# Patient Record
Sex: Female | Born: 2000 | Race: White | Hispanic: No | Marital: Single | Smoking: Never smoker
Health system: Southern US, Community
[De-identification: ages and names within clinical notes are randomized; demographics above are authoritative.]

## PROBLEM LIST (undated history)

## (undated) HISTORY — PX: WISDOM TOOTH EXTRACTION: SHX21

---
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
## 2020-06-21 ENCOUNTER — Other Ambulatory Visit: Payer: Self-pay | Admitting: Pediatrics

## 2020-06-21 ENCOUNTER — Ambulatory Visit
Admission: RE | Admit: 2020-06-21 | Discharge: 2020-06-21 | Disposition: A | Discharge status: Home / Self Care | Payer: 59 | Source: Ambulatory Visit | Attending: Pediatrics | Admitting: Pediatrics

## 2020-06-21 DIAGNOSIS — R591 Generalized enlarged lymph nodes: Secondary | ICD-10-CM

## 2021-06-17 ENCOUNTER — Encounter: Payer: Self-pay | Admitting: Family

## 2021-06-17 ENCOUNTER — Other Ambulatory Visit: Payer: Self-pay

## 2021-06-17 ENCOUNTER — Ambulatory Visit (INDEPENDENT_AMBULATORY_CARE_PROVIDER_SITE_OTHER): Payer: 59 | Admitting: Family

## 2021-06-17 VITALS — BP 110/60 | HR 90 | Temp 97.3°F | Ht 62.4 in | Wt 156.4 lb

## 2021-06-17 DIAGNOSIS — F419 Anxiety disorder, unspecified: Secondary | ICD-10-CM | POA: Diagnosis not present

## 2021-06-17 DIAGNOSIS — R5383 Other fatigue: Secondary | ICD-10-CM | POA: Diagnosis not present

## 2021-06-17 DIAGNOSIS — F32A Depression, unspecified: Secondary | ICD-10-CM

## 2021-06-17 MED ORDER — SERTRALINE HCL 25 MG PO TABS: 25.0000 mg | ORAL_TABLET | Freq: Every day | ORAL | 0 refills | Status: DC

## 2021-06-17 NOTE — Assessment & Plan Note (Addendum)
Pt present with her mother. Sees therapist locally and at college in [place]. Would like to start a medication. Discussed different options, pt would like to try zoloft. Advised mother & pt. on use and side effects, including black warning label, will be living with mother over holiday break, f/u in 2 weeks.

## 2021-06-17 NOTE — Patient Instructions (Addendum)
Welcome to [facility]! It was a pleasure meeting you today.  As discussed, I have sent your prescription to your pharmacy. Please schedule a 2 week follow up visit today. I have attached some information that may also be helpful to you.  Have a wonderful holiday!  PLEASE NOTE:  If you had any LAB tests please let us know if you have not heard back within a few days. You may see your results on MyChart before we have a chance to review them but we will give you a call once they are reviewed by [place]. If we ordered any REFERRALS today, please let us know if you have not heard from their office within the next week.  Let us know through MyChart if you are needing REFILLS, or have your pharmacy send [place] the request. You can also use MyChart to communicate with me or any office staff.  Please try these tips to maintain a healthy lifestyle:  Eat most of your calories during the day when you are active. Eliminate processed foods including packaged sweets (pies, cakes, cookies), reduce intake of potatoes, white bread, white pasta, and white rice. Look for whole grain options, oat flour or almond flour.  Each meal should contain half fruits/vegetables, one quarter protein, and one quarter carbs (no bigger than a computer mouse).  Cut down on sweet beverages. This includes juice, soda, and sweet tea. Also watch fruit intake, though this is a healthier sweet option, it still contains natural sugar! Limit to 3 servings daily.  Drink at least 1 glass of water with each meal and aim for at least 8 glasses per day  Exercise at least 150 minutes every week.

## 2021-06-17 NOTE — Assessment & Plan Note (Signed)
HX of iron def anemia, pt couldn't tolerate iron supplements. Advised on ER and lower dose formulations as well as eating an iron rich diet. Pt refusing labs today, feels most of her fatigue is related to current depression.

## 2021-06-17 NOTE — Progress Notes (Signed)
New Patient Office Visit  Subjective:  Patient ID: Allison Shepherd, female    DOB: [birth year]  Age: 20 y.o. MRN: [id number]  CC:  Chief Complaint  Patient presents with   Establish Care   Fatigue   Anxiety   Depression    HPI Carolyn Honse presents for establishing care and 2 new problems. Anxiety/Depression: Patient complains of anxiety disorder and depression .   She has the following symptoms: difficulty concentrating, feelings of losing control, insomnia, irritable, racing thoughts, depressed mood .  Onset of symptoms was approximately 3 months ago, She denies current suicidal and homicidal ideation.  Possible organic causes contributing are: none.  Risk factors: positive family history in  father  Previous treatment includes individual therapy.   Fatigue: Pt. reports onset: months ago Occurs upon awakening:  sometimes; mostly during the day & evenings. Hours of sleep: 6; New medications or change in current med doses: no. Recent labs: no; but has history of anemia. Exercise: no; High carb/sugar diet: no; Sx of depression: yes.  Depression screen PHQ 2/9 06/17/2021  Decreased Interest 2  Down, Depressed, Hopeless 2  PHQ - 2 Score 4  Altered sleeping 3  Tired, decreased energy 3  Change in appetite 1  Feeling bad or failure about yourself  3  Trouble concentrating 2  Moving slowly or fidgety/restless 0  Suicidal thoughts 0  PHQ-9 Score 16  Difficult doing work/chores Extremely dIfficult   GAD 7 : Generalized Anxiety Score 06/17/2021  Nervous, Anxious, on Edge 3  Control/stop worrying 3  Worry too much - different things 3  Trouble relaxing 3  Restless 2  Easily annoyed or irritable 1  Afraid - awful might happen 2  Total GAD 7 Score 17  Anxiety Difficulty Extremely difficult     History reviewed. No pertinent past medical history.  Past Surgical History:  Procedure Laterality Date   WISDOM TOOTH EXTRACTION      History reviewed. No pertinent family  history.  Social History   Socioeconomic History   Marital status: Single    Spouse name: Not on file   Number of children: Not on file   Years of education: Not on file   Highest education level: Not on file  Occupational History   Not on file  Tobacco Use   Smoking status: Never   Smokeless tobacco: Never  Substance and Sexual Activity   Alcohol use: Never   Drug use: Never   Sexual activity: Not on file  Other Topics Concern   Not on file  Social History Narrative   Not on file   Social Determinants of Health   Financial Resource Strain: Not on file  Food Insecurity: Not on file  Transportation Needs: Not on file  Physical Activity: Not on file  Stress: Not on file  Social Connections: Not on file  Intimate Partner Violence: Not on file    Objective:   Today's Vitals: BP 110/60    Pulse 90    Temp (!) 97.3 F (36.3 C) (Temporal)    Ht 5' 2.4" (1.585 m)    Wt 156 lb 6.4 oz (70.9 kg)    LMP  (LMP Unknown)    SpO2 99%    BMI 28.24 kg/m   Physical Exam Vitals and nursing note reviewed.  Constitutional:      Appearance: Normal appearance.  Cardiovascular:     Rate and Rhythm: Normal rate and regular rhythm.  Pulmonary:     Effort: Pulmonary effort is normal.  Breath sounds: Normal breath sounds.  Musculoskeletal:        General: Normal range of motion.  Skin:    General: Skin is warm and dry.  Neurological:     Mental Status: She is alert.  Psychiatric:        Mood and Affect: Mood is depressed. Mood is not anxious. Affect is flat. Affect is not tearful.        Behavior: Behavior normal.   Assessment & Plan:   Problem List Items Addressed This Visit       Other   Anxiety and depression - Primary    Pt present with her mother. Sees therapist locally and at college in [place]. Would like to start a medication. Discussed different options, pt would like to try zoloft. Advised mother & pt. on use and side effects, including black warning label,  will be living with mother over holiday break, f/u in 2 weeks.      Relevant Medications   sertraline (ZOLOFT) 25 MG tablet   Fatigue    HX of iron def anemia, pt couldn't tolerate iron supplements. Advised on ER and lower dose formulations as well as eating an iron rich diet. Pt refusing labs today, feels most of her fatigue is related to current depression.      Extra time spent with patient today which consisted of discussing patient history, diagnoses, work up, treatment, answering questions, and documentation.   Outpatient Encounter Medications as of 06/17/2021  Medication Sig   sertraline (ZOLOFT) 25 MG tablet Take 1 tablet (25 mg total) by mouth daily.   [DISCONTINUED] peppermint oil liquid FDGard OTC contains peppermint oil and caraway oil and l-Menthol,   No facility-administered encounter medications on file as of 06/17/2021.    Follow-up: Return in about 2 weeks (around 07/01/2021) for anx/depr.   [name], NP

## 2021-07-07 ENCOUNTER — Encounter: Payer: Self-pay | Admitting: Family

## 2021-07-07 ENCOUNTER — Ambulatory Visit (INDEPENDENT_AMBULATORY_CARE_PROVIDER_SITE_OTHER): Payer: 59 | Admitting: Family

## 2021-07-07 ENCOUNTER — Other Ambulatory Visit: Payer: Self-pay

## 2021-07-07 VITALS — BP 119/77 | HR 90 | Temp 98.1°F | Ht 62.4 in | Wt 158.8 lb

## 2021-07-07 DIAGNOSIS — F32A Depression, unspecified: Secondary | ICD-10-CM

## 2021-07-07 DIAGNOSIS — F419 Anxiety disorder, unspecified: Secondary | ICD-10-CM

## 2021-07-07 DIAGNOSIS — R5383 Other fatigue: Secondary | ICD-10-CM | POA: Diagnosis not present

## 2021-07-07 MED ORDER — SERTRALINE HCL 50 MG PO TABS: 50.0000 mg | ORAL_TABLET | Freq: Every day | ORAL | 0 refills | Status: DC

## 2021-07-07 NOTE — Progress Notes (Signed)
Subjective:     Patient ID: Allison Shepherd, female    DOB: Nov 11, 2000, 21 y.o.   MRN: [id number]  Chief Complaint  Patient presents with   Depression    She states that she can tell a difference when being on the medication. She is requesting an increase .    Anxiety    HPI: Anxiety/Depression: Patient complains of anxiety disorder and depression.   She has the following symptoms: difficulty concentrating, feelings of losing control, insomnia, irritable, racing thoughts, depressed mood.  Onset of symptoms was approximately 3 months ago, She denies current suicidal and homicidal ideation.  Possible organic causes contributing are: none.  Risk factors: positive family history in  father  Previous treatment includes individual therapy.  Started Zoloft 2 weeks ago, doing better, felt tired and switched to taking in evenings, wants to increase dose. She is going back to college in 2 weeks. Fatigue: Pt. reports onset: months ago Occurs upon awakening:  sometimes; mostly during the day & evenings. Hours of sleep: 6; New medications or change in current med doses: no. Recent labs: no; but has history of anemia. Exercise: no; High carb/sugar diet: no; Sx of depression: yes. Reports slight worsening after starting Zoloft, then switched to evenings and overall doing better.  Health Maintenance Due  Topic Date Due   HPV VACCINES (1 - 2-dose series) Never done   HIV Screening  Never done   Hepatitis C Screening  Never done   TETANUS/TDAP  Never done    History reviewed. No pertinent past medical history.  Past Surgical History:  Procedure Laterality Date   WISDOM TOOTH EXTRACTION      Outpatient Medications Prior to Visit  Medication Sig Dispense Refill   sertraline (ZOLOFT) 25 MG tablet Take 1 tablet (25 mg total) by mouth daily. 30 tablet 0   No facility-administered medications prior to visit.    No Known Allergies      Objective:    Physical Exam Vitals and nursing note  reviewed.  Constitutional:      Appearance: Normal appearance.  Cardiovascular:     Rate and Rhythm: Normal rate and regular rhythm.  Pulmonary:     Effort: Pulmonary effort is normal.     Breath sounds: Normal breath sounds.  Musculoskeletal:        General: Normal range of motion.  Skin:    General: Skin is warm and dry.  Neurological:     Mental Status: She is alert.  Psychiatric:        Mood and Affect: Mood normal.        Behavior: Behavior normal.    BP 119/77    Pulse 90    Temp 98.1 F (36.7 C) (Temporal)    Ht 5' 2.4" (1.585 m)    Wt 158 lb 12.8 oz (72 kg)    LMP  (LMP Unknown)    SpO2 99%    BMI 28.67 kg/m  Wt Readings from Last 3 Encounters:  07/07/21 158 lb 12.8 oz (72 kg)  06/17/21 156 lb 6.4 oz (70.9 kg)  12/10/17 126 lb (57.2 kg) (58 %, Z= 0.21)*   * Growth percentiles are based on CDC (Girls, 2-20 Years) data.       Assessment & Plan:   Problem List Items Addressed This Visit       Other   Anxiety and depression - Primary    doing better on zoloft, requesting increase in dose, denies SE other than some fatigue, better with  switching to qpm dosing. pt will return to college in 2 weeks. advised to sch f/u when back home in 2-3 mos.      Relevant Medications   sertraline (ZOLOFT) 50 MG tablet   Fatigue    doing better, zoloft improving depression.       Meds ordered this encounter  Medications   sertraline (ZOLOFT) 50 MG tablet    Sig: Take 1 tablet (50 mg total) by mouth daily.    Dispense:  90 tablet    Refill:  0    Order Specific Question:   Supervising Provider    Answer:   ANDY, CAMILLE L [[id number]]

## 2021-07-07 NOTE — Assessment & Plan Note (Addendum)
doing better on zoloft, requesting increase in dose, denies SE other than some fatigue, better with switching to qpm dosing. pt will return to college in 2 weeks. advised to sch f/u when back home in 2-3 mos.

## 2021-07-07 NOTE — Assessment & Plan Note (Signed)
doing better, zoloft improving depression.

## 2021-07-15 ENCOUNTER — Encounter: Payer: Self-pay | Admitting: Family

## 2021-07-19 ENCOUNTER — Other Ambulatory Visit: Payer: Self-pay | Admitting: Family

## 2021-07-19 DIAGNOSIS — F419 Anxiety disorder, unspecified: Secondary | ICD-10-CM

## 2021-07-19 MED ORDER — SERTRALINE HCL 50 MG PO TABS: 50.0000 mg | ORAL_TABLET | Freq: Every day | ORAL | 2 refills | Status: DC

## 2021-09-05 ENCOUNTER — Ambulatory Visit: Payer: 59 | Admitting: Family

## 2023-01-19 ENCOUNTER — Encounter: Payer: Self-pay | Admitting: Family Medicine

## 2023-01-19 ENCOUNTER — Ambulatory Visit (INDEPENDENT_AMBULATORY_CARE_PROVIDER_SITE_OTHER): Payer: 59 | Admitting: Family Medicine

## 2023-01-19 VITALS — BP 134/78 | HR 88 | Temp 98.4°F | Wt 165.2 lb

## 2023-01-19 DIAGNOSIS — R059 Cough, unspecified: Secondary | ICD-10-CM

## 2023-01-19 DIAGNOSIS — R0982 Postnasal drip: Secondary | ICD-10-CM | POA: Diagnosis not present

## 2023-01-19 LAB — POC COVID19 BINAXNOW: SARS Coronavirus 2 Ag: NEGATIVE

## 2023-01-19 NOTE — Progress Notes (Signed)
   Established Patient Office Visit   Subjective  Patient ID: Allison Shepherd, female    DOB: 28-Feb-2001  Age: 22 y.o. MRN: [id number]  Chief Complaint  Patient presents with   Cough    Started 2 weeks ago with tickle in throat, has become more mucusy over the last few days. Pain in chest that she states is more like a burning started over the last 24 hours. Has not taken anything, but states she feels as th her heart rate is high.     Pt is a 22 yo female followed by [name], NP and seen for ongoing concern.  Pt with a cough/tickle in throat x 3 wks. Increased mucus in last few days. Last night had burning in chest and subjective tachycardia.  Has not tried anything for sx.  Denies heart burn and allergies.    Patient Active Problem List   Diagnosis Date Noted   Anxiety and depression 06/17/2021   Fatigue 06/17/2021   Functional dyspepsia 12/10/2017   Past Surgical History:  Procedure Laterality Date   WISDOM TOOTH EXTRACTION     Social History   Tobacco Use   Smoking status: Never   Smokeless tobacco: Never  Substance Use Topics   Alcohol use: Never   Drug use: Never   History reviewed. No pertinent family history. No Known Allergies    ROS Negative unless stated above    Objective:     BP 134/78 (BP Location: Right Arm, Patient Position: Sitting, Cuff Size: Normal)   Pulse 88   Temp 98.4 F (36.9 C) (Oral)   Wt 165 lb 3.2 oz (74.9 kg)   SpO2 98%   BMI 29.83 kg/m    Physical Exam Constitutional:      General: She is not in acute distress.    Appearance: Normal appearance.  HENT:     Head: Normocephalic and atraumatic.     Right Ear: Hearing and tympanic membrane normal.     Left Ear: Hearing and tympanic membrane normal.     Ears:     Comments: L TM full.  R TM nml.    Nose: Nose normal.     Mouth/Throat:     Mouth: Mucous membranes are moist.     Comments: Clear post nasal drainage. Cardiovascular:     Rate and Rhythm: Normal rate and  regular rhythm.     Heart sounds: Normal heart sounds. No murmur heard.    No gallop.  Pulmonary:     Effort: Pulmonary effort is normal. No respiratory distress.     Breath sounds: Normal breath sounds. No wheezing, rhonchi or rales.  Skin:    General: Skin is warm and dry.  Neurological:     Mental Status: She is alert and oriented to person, place, and time.      Results for orders placed or performed in visit on 01/19/23  POC COVID-19  Result Value Ref Range   SARS Coronavirus 2 Ag Negative Negative      Assessment & Plan:  Cough, unspecified type -     POC COVID-19 BinaxNow  Post-nasal drainage  New problem.  COVID test negative.  Post-nasal drainage likely from allergies vs silent reflux.  OTC antihistamine.  Given sample of zyrtec in clinic.  Return f/u with pcp if symptoms worsen or fail to improve.   Deeann Saint, MD

## 2023-01-25 ENCOUNTER — Encounter: Payer: Self-pay | Admitting: Family

## 2024-01-11 ENCOUNTER — Ambulatory Visit (INDEPENDENT_AMBULATORY_CARE_PROVIDER_SITE_OTHER): Admitting: Family

## 2024-01-11 VITALS — BP 116/80 | HR 99 | Temp 98.4°F | Ht 62.5 in | Wt 185.4 lb

## 2024-01-11 DIAGNOSIS — F32A Depression, unspecified: Secondary | ICD-10-CM

## 2024-01-11 DIAGNOSIS — Z1159 Encounter for screening for other viral diseases: Secondary | ICD-10-CM

## 2024-01-11 DIAGNOSIS — E66811 Obesity, class 1: Secondary | ICD-10-CM

## 2024-01-11 DIAGNOSIS — Z114 Encounter for screening for human immunodeficiency virus [HIV]: Secondary | ICD-10-CM

## 2024-01-11 DIAGNOSIS — F419 Anxiety disorder, unspecified: Secondary | ICD-10-CM

## 2024-01-11 DIAGNOSIS — E6609 Other obesity due to excess calories: Secondary | ICD-10-CM | POA: Insufficient documentation

## 2024-01-11 DIAGNOSIS — Z0000 Encounter for general adult medical examination without abnormal findings: Secondary | ICD-10-CM

## 2024-01-11 DIAGNOSIS — Z6833 Body mass index (BMI) 33.0-33.9, adult: Secondary | ICD-10-CM

## 2024-01-11 LAB — COMPREHENSIVE METABOLIC PANEL WITH GFR
ALT: 17 U/L (ref 0–35)
AST: 15 U/L (ref 0–37)
Albumin: 4.6 g/dL (ref 3.5–5.2)
Alkaline Phosphatase: 67 U/L (ref 39–117)
BUN: 13 mg/dL (ref 6–23)
CO2: 29 meq/L (ref 19–32)
Calcium: 9.2 mg/dL (ref 8.4–10.5)
Chloride: 103 meq/L (ref 96–112)
Creatinine, Ser: 0.62 mg/dL (ref 0.40–1.20)
GFR: 125.79 mL/min (ref >60.00)
Glucose, Bld: 80 mg/dL (ref 70–99)
Potassium: 3.7 meq/L (ref 3.5–5.1)
Sodium: 137 meq/L (ref 135–145)
Total Bilirubin: 0.3 mg/dL (ref 0.2–1.2)
Total Protein: 8 g/dL (ref 6.0–8.3)

## 2024-01-11 LAB — TSH: TSH: 1.4 u[IU]/mL (ref 0.35–5.50)

## 2024-01-11 LAB — LIPID PANEL
Cholesterol: 169 mg/dL (ref 0–200)
HDL: 61.9 mg/dL (ref >39.00)
LDL Cholesterol: 86 mg/dL (ref 0–99)
NonHDL: 107.11
Total CHOL/HDL Ratio: 3
Triglycerides: 108 mg/dL (ref 0.0–149.0)
VLDL: 21.6 mg/dL (ref 0.0–40.0)

## 2024-01-11 LAB — CBC WITH DIFFERENTIAL/PLATELET
Basophils Absolute: 0.1 K/uL (ref 0.0–0.1)
Basophils Relative: 0.8 % (ref 0.0–3.0)
Eosinophils Absolute: 0.1 K/uL (ref 0.0–0.7)
Eosinophils Relative: 0.7 % (ref 0.0–5.0)
HCT: 31.7 % — ABNORMAL LOW (ref 36.0–46.0)
Hemoglobin: 9.2 g/dL — ABNORMAL LOW (ref 12.0–15.0)
Lymphocytes Relative: 31.5 % (ref 12.0–46.0)
Lymphs Abs: 2.2 K/uL (ref 0.7–4.0)
MCHC: 29 g/dL — ABNORMAL LOW (ref 30.0–36.0)
MCV: 57.4 fl — ABNORMAL LOW (ref 78.0–100.0)
Monocytes Absolute: 0.7 K/uL (ref 0.1–1.0)
Monocytes Relative: 10 % (ref 3.0–12.0)
Neutro Abs: 3.9 K/uL (ref 1.4–7.7)
Neutrophils Relative %: 57 % (ref 43.0–77.0)
Platelets: 329 K/uL (ref 150.0–400.0)
RBC: 5.52 Mil/uL — ABNORMAL HIGH (ref 3.87–5.11)
RDW: 20.7 % — ABNORMAL HIGH (ref 11.5–15.5)
WBC: 6.8 K/uL (ref 4.0–10.5)

## 2024-01-11 MED ORDER — SERTRALINE HCL 100 MG PO TABS: 100.0000 mg | ORAL_TABLET | Freq: Every day | ORAL | 1 refills | Status: AC

## 2024-01-11 NOTE — Assessment & Plan Note (Signed)
 Significant weight gain despite exercise and healthy diet. Discussed dietary modifications. - Refer to healthy weight management clinic. - Advised on dietary modifications to increase protein and reduce carbohydrates, portion control, eating last meal (protein & vege only) prior to 7pm. - Increase cardio exercise up to 30 minutes as many days as possible, at least 5. - Recommend tracking caloric intake and exercise using apps. - Will continue to follow.

## 2024-01-11 NOTE — Patient Instructions (Addendum)
 It was very nice to see you today!   I will review your lab results via MyChart in a few days. I have sent over a referral to our [facility] clinic. They will call you to schedule. Have a great rest of the summer!  Start working on what we discussed today regarding your weight loss goals:  1) Eat small portions, ok to eat 6 mini meals vs. 3 big meals if this controls your hunger  better. 2) Eat until full and then STOP! This is your body telling you it has had enough. 3) Drink at least 64 oz water = 2 liters = 8, 8oz cups DAILY. 4) Eat most of your calories earlier in the day (if you work during the day).  Want to eat within a 8-10hour window if possible. No eating after 7pm, only no calorie drinks or water from 7p until bedtime. 5) Look for low carb, high protein foods/recipes. Avoid simple carbs including sweets, white bread, rice, potatoes, pasta. Look for whole grain/whole wheat/or almond flour if eating these foods. 6) High protein foods: meats (limit red meat), including [place], chicken, pork, FISH (best source) nuts, cheese, beans (black beans, soy/edamame beans are best). Protein drinks, bars are also good but must have low sugar, look for < 10 grams. 7) Avoid processed/refined sugar and artificial sweeteners if possible. Stevia, Erythritol, or Monk fruit sweetener are preferred, natural, no calorie sweeteners.  Natural sweeteners like Agave or honey in very small amounts are ok also.     PLEASE NOTE:  If you had any lab tests please let us  know if you have not heard back within a few days. You may see your results on MyChart before we have a chance to review them but we will give you a call once they are reviewed by us . If we ordered any referrals today, please let us  know if you have not heard from their office within the next week.

## 2024-01-11 NOTE — Assessment & Plan Note (Signed)
 Well-managed on sertraline  100 mg daily. Denies any SE. Agreed to take over prescribing with monitoring every six months. - Prescribe sertraline  100 mg daily with 90-day supply. - Schedule follow-up every six months to monitor condition.

## 2024-01-11 NOTE — Progress Notes (Signed)
 Phone [phone]  Subjective:   Patient is a 23 y.o. female presenting for annual physical.    Chief Complaint  Patient presents with   Annual Exam    Patients states she has noticed when she is eating her hunger declines quickly. Has shaking, hot sweat, exhaustion and feels like it gets there very quikcly. States it has gotten worst in the last 3-4 months.   Discussed the use of AI scribe software for clinical note transcription with the patient, who gave verbal consent to proceed.  History of Present Illness Chaundra Kemp is a 23 year old female who presents for an annual physical exam and evaluation of recent weight gain and hypoglycemic symptoms.  Hypoglycemic symptoms - Episodes of rapidly progressing hunger leading to hot sweats, shaking, and nausea - Symptoms are relieved by eating - Increased frequency of episodes - Symptoms impact daily activities  Weight gain - Recent increase in body weight  Mood and anxiety symptoms - Takes sertraline  100 mg daily for anxiety and depression - Mood is stable on current dose - Attends counseling sessions  Menstrual history - Menstrual cycles are regular, lasting four to five days - Menses start heavy and become lighter - Not on hormonal birth control - Never sexually active  Lifestyle factors - Maintains regular physical activity, primarily walking and home exercises - No tobacco, alcohol, or recreational drug use  See problem oriented charting- ROS- full  review of systems was completed and negative except for what is noted in HPI above.  The following were reviewed and entered/updated in epic: No past medical history on file. Patient Active Problem List   Diagnosis Date Noted   Class 1 obesity due to excess calories without serious comorbidity with body mass index (BMI) of 33.0 to 33.9 in adult 01/11/2024   Anxiety and depression 06/17/2021   Fatigue 06/17/2021   Functional dyspepsia 12/10/2017   Past Surgical  History:  Procedure Laterality Date   WISDOM TOOTH EXTRACTION      No family history on file.  Medications- reviewed and updated Current Outpatient Medications  Medication Sig Dispense Refill   sertraline  (ZOLOFT ) 100 MG tablet Take 1 tablet (100 mg total) by mouth daily. 90 tablet 1   No current facility-administered medications for this visit.    Allergies-reviewed and updated No Known Allergies  Social History   Social History Narrative   Not on file    Objective:  BP 116/80   Pulse 99   Temp 98.4 F (36.9 C) (Temporal)   Ht 5' 2.5 (1.588 m)   Wt 185 lb 6.4 oz (84.1 kg)   SpO2 99%   BMI 33.37 kg/m  Physical Exam Vitals and nursing note reviewed.  Constitutional:      Appearance: Normal appearance. She is obese.  HENT:     Head: Normocephalic.     Right Ear: Tympanic membrane normal.     Left Ear: Tympanic membrane normal.     Nose: Nose normal.     Mouth/Throat:     Mouth: Mucous membranes are moist.  Eyes:     Pupils: Pupils are equal, round, and reactive to light.  Cardiovascular:     Rate and Rhythm: Normal rate and regular rhythm.  Pulmonary:     Effort: Pulmonary effort is normal.     Breath sounds: Normal breath sounds.  Musculoskeletal:        General: Normal range of motion.     Cervical back: Normal range of motion.  Lymphadenopathy:  Cervical: No cervical adenopathy.  Skin:    General: Skin is warm and dry.  Neurological:     Mental Status: She is alert.  Psychiatric:        Mood and Affect: Mood normal.        Behavior: Behavior normal.     Assessment and Plan   Health Maintenance counseling: 1. Anticipatory guidance: Patient counseled regarding regular dental exams q6 months, eye exams,  avoiding smoking and second hand smoke, limiting alcohol to 1 beverage per day, no illicit drugs.   2. Risk factor reduction:  Advised patient of need for regular exercise and diet rich with fruits and vegetables to reduce risk of heart attack  and stroke. Wt Readings from Last 3 Encounters:  01/11/24 185 lb 6.4 oz (84.1 kg)  01/19/23 165 lb 3.2 oz (74.9 kg)  07/07/21 158 lb 12.8 oz (72 kg)   3. Immunizations/screenings/ancillary studies Immunization History  Administered Date(s) Administered   PFIZER(Purple Top)SARS-COV-2 Vaccination 09/07/2019, 10/07/2019   Health Maintenance Due  Topic Date Due   HPV VACCINES (1 - 3-dose series) Never done   HIV Screening  Never done   Meningococcal B Vaccine (1 of 2 - Standard) Never done   Diabetic kidney evaluation - Urine ACR  Never done   Hepatitis C Screening  Never done   Hepatitis B Vaccines (1 of 3 - 19+ 3-dose series) Never done   Cervical Cancer Screening (Pap smear)  Never done    4. Cervical cancer screening: N/A, never sexually active. 5. Skin cancer screening- advised regular sunscreen use. Denies worrisome, changing, or new skin lesions.  6. Birth control/STD check: none, N/A 7. Smoking associated screening: never smoked  8. Alcohol screening: none  Assessment & Plan Hypoglycemia Symptoms consistent with hypoglycemia despite regular meals. Thyroid dysfunction considered. - Order thyroid function test. - Advise eating every three hours, including healthy snacks. - Hydrate with at least 2 liters water every day. - Educate on avoiding long periods without food.  Weight Gain/Obesity Significant weight gain despite exercise and healthy diet. Discussed dietary modifications. - Refer to healthy weight management clinic. - Advised on dietary modifications to increase protein and reduce carbohydrates, portion control, eating last meal (protein & vege only) prior to 7pm. - Increase cardio exercise up to 30 minutes as many days as possible, at least 5. - Recommend tracking caloric intake and exercise using apps. - Will continue to follow.  Anxiety and Depression Well-managed on sertraline  100 mg daily. Denies any SE. Agreed to take over prescribing with monitoring every  six months. - Prescribe sertraline  100 mg daily with 90-day supply. - Schedule follow-up every 6 months to monitor condition.  General Health Maintenance Pap smear not necessary. HPV vaccinated. Dermatology recommended due to fair skin and sunburn history. - Ordering complete CPE lab panel including Hep C & HIV screenings. - Labs to be reviewed via MyChart a few business days after resulted. - Can refer to dermatology for skin cancer screening if needed. - Encourage regular exercise and healthy lifestyle. - Advised on dental hygiene, including regular flossing.  Recommended follow up:  Return in about 6 months (around 07/13/2024) for depression, anxiety, med refills. Future Appointments  Date Time Provider Department Center  01/16/2024  3:50 PM Booker Darice SAUNDERS, FNP [facility] None     Lab/Order associations: not fasting    Lucius Krabbe, NP

## 2024-01-12 LAB — HIV ANTIBODY (ROUTINE TESTING W REFLEX): HIV 1&2 Ab, 4th Generation: NONREACTIVE

## 2024-01-12 LAB — HEPATITIS C ANTIBODY: Hepatitis C Ab: NONREACTIVE

## 2024-01-14 ENCOUNTER — Encounter (INDEPENDENT_AMBULATORY_CARE_PROVIDER_SITE_OTHER): Payer: Self-pay

## 2024-01-14 ENCOUNTER — Ambulatory Visit: Payer: Self-pay | Admitting: Family

## 2024-01-16 ENCOUNTER — Ambulatory Visit (INDEPENDENT_AMBULATORY_CARE_PROVIDER_SITE_OTHER): Admitting: Family Medicine

## 2024-01-16 ENCOUNTER — Encounter: Payer: Self-pay | Admitting: Family Medicine

## 2024-01-16 ENCOUNTER — Other Ambulatory Visit: Payer: Self-pay | Admitting: Family Medicine

## 2024-01-16 VITALS — BP 131/84 | HR 91 | Temp 99.6°F | Ht 62.0 in | Wt 186.0 lb

## 2024-01-16 DIAGNOSIS — E162 Hypoglycemia, unspecified: Secondary | ICD-10-CM | POA: Insufficient documentation

## 2024-01-16 DIAGNOSIS — D649 Anemia, unspecified: Secondary | ICD-10-CM | POA: Insufficient documentation

## 2024-01-16 LAB — GLUCOSE, POCT (MANUAL RESULT ENTRY): POC Glucose: 96 mg/dL (ref 70–99)

## 2024-01-16 NOTE — Progress Notes (Signed)
 New Patient Office Visit  Subjective    Patient ID: Allison Shepherd, female    DOB: 2000-12-27  Age: 23 y.o. MRN: [id number]  CC:  Chief Complaint  Patient presents with   Establish Care    Glucose test    HPI Allison Shepherd presents to establish care with this practice. She is new to me.  Has been having symptoms of low blood sugar.   Sensation of low blood sugars:  Feels hungry every 2 hours even with eating. Eating breakfast 0900-1000, within one hour of waking, 1200 lunch, 4:00 snack, 6:30  dinner.  Shakiness, headache, nausea, hot sweats. Symptoms getting worse in last 3 months.  Anemia: CBC on 01/11/24 MCV: 57.4 MCHC: 29.0 PCP prescribed iron, has not started taking yet. Will get iron study today.  Needs iron study done today.     Outpatient Encounter Medications as of 01/16/2024  Medication Sig   sertraline  (ZOLOFT ) 100 MG tablet Take 1 tablet (100 mg total) by mouth daily.   No facility-administered encounter medications on file as of 01/16/2024.    History reviewed. No pertinent past medical history.  Past Surgical History:  Procedure Laterality Date   WISDOM TOOTH EXTRACTION      History reviewed. No pertinent family history.  Social History   Socioeconomic History   Marital status: Single    Spouse name: Not on file   Number of children: Not on file   Years of education: Not on file   Highest education level: Bachelor's degree (e.g., BA, AB, BS)  Occupational History   Not on file  Tobacco Use   Smoking status: Never   Smokeless tobacco: Never  Substance and Sexual Activity   Alcohol use: Never   Drug use: Never   Sexual activity: Not on file  Other Topics Concern   Not on file  Social History Narrative   Not on file   Social Drivers of Health   Financial Resource Strain: Low Risk  (01/08/2024)   Overall Financial Resource Strain (CARDIA)    Difficulty of Paying Living Expenses: Not hard at all  Food Insecurity: No Food Insecurity  (01/08/2024)   Hunger Vital Sign    Worried About Running Out of Food in the Last Year: Never true    Ran Out of Food in the Last Year: Never true  Transportation Needs: No Transportation Needs (01/08/2024)   PRAPARE - Administrator, Civil Service (Medical): No    Lack of Transportation (Non-Medical): No  Physical Activity: Sufficiently Active (01/08/2024)   Exercise Vital Sign    Days of Exercise per Week: 3 days    Minutes of Exercise per Session: 60 min  Stress: No Stress Concern Present (01/08/2024)   [employer] of Occupational Health - Occupational Stress Questionnaire    Feeling of Stress: Only a little  Social Connections: Moderately Isolated (01/08/2024)   Social Connection and Isolation Panel    Frequency of Communication with Friends and Family: Three times a week    Frequency of Social Gatherings with Friends and Family: More than three times a week    Attends Religious Services: More than 4 times per year    Active Member of [employer] or Organizations: No    Attends Banker Meetings: Not on file    Marital Status: Never married  Intimate Partner Violence: Not At Risk (01/11/2024)   Humiliation, Afraid, Rape, and Kick questionnaire    Fear of Current or Ex-Partner: No    Emotionally Abused:  No    Physically Abused: No    Sexually Abused: No    ROS      Objective    BP 131/84 (BP Location: Left Arm, Patient Position: Sitting, Cuff Size: Normal)   Pulse 91   Temp 99.6 F (37.6 C) (Oral)   Ht 5' 2 (1.575 m)   Wt 186 lb (84.4 kg)   LMP 12/16/2023 (Approximate)   SpO2 100%   BMI 34.02 kg/m   Physical Exam Vitals and nursing note reviewed.  Constitutional:      General: She is not in acute distress.    Appearance: Normal appearance.  Cardiovascular:     Rate and Rhythm: Normal rate.     Heart sounds: Normal heart sounds.  Pulmonary:     Effort: Pulmonary effort is normal.     Breath sounds: Normal breath sounds.  Skin:    General:  Skin is warm and dry.  Neurological:     General: No focal deficit present.     Mental Status: She is alert. Mental status is at baseline.  Psychiatric:        Mood and Affect: Mood normal.        Behavior: Behavior normal.        Thought Content: Thought content normal.        Judgment: Judgment normal.         Assessment & Plan:   Problem List Items Addressed This Visit     Low blood sugar - Primary   Describes episodes of feeling shakiness, nausea and hot sweats. Symptom usually occur if she goes longer than 2 hours before eating. Recommend purchasing  glucose meter to check blood sugar when these episodes occur. Eat every 2 hours with complex carbohydrates, protein, and healthy fats to stabilize blood sugar. She will add snacks to her current eating pattern.  Follow-up in one week with blood sugar log.  POC glucose today: 96      Relevant Orders   POCT Glucose (CBG) (Completed)   Anemia   Recent CBC shows low H&H,  MCV, and MCHC.  Iron study today. Has not started iron supplement per previous PCP. Depending on results of iron, may benefit from hematology referral for iron infusions.  Endorses fatigue.        Relevant Orders   Iron, TIBC and Ferritin Panel  Agrees with plan of care discussed.  Questions answered.   Return in about 8 days (around 01/24/2024) for hypoglycemia .   Darice JONELLE Brownie, FNP

## 2024-01-16 NOTE — Assessment & Plan Note (Addendum)
 Describes episodes of feeling shakiness, nausea and hot sweats. Symptom usually occur if she goes longer than 2 hours before eating. Recommend purchasing  glucose meter to check blood sugar when these episodes occur. Eat every 2 hours with complex carbohydrates, protein, and healthy fats to stabilize blood sugar. She will add snacks to her current eating pattern.  Follow-up in one week with blood sugar log.  POC glucose today: 96

## 2024-01-16 NOTE — Assessment & Plan Note (Addendum)
 Recent CBC shows low H&H,  MCV, and MCHC.  Iron study today. Has not started iron supplement per previous PCP. Depending on results of iron, may benefit from hematology referral for iron infusions.  Endorses fatigue.

## 2024-01-17 ENCOUNTER — Other Ambulatory Visit: Payer: Self-pay | Admitting: Family Medicine

## 2024-01-17 ENCOUNTER — Ambulatory Visit: Payer: Self-pay | Admitting: Family Medicine

## 2024-01-17 DIAGNOSIS — D509 Iron deficiency anemia, unspecified: Secondary | ICD-10-CM

## 2024-01-17 LAB — IRON,TIBC AND FERRITIN PANEL
Ferritin: 5 ng/mL — ABNORMAL LOW (ref 15–150)
Iron Saturation: 3 % — CL (ref 15–55)
Iron: 14 ug/dL — ABNORMAL LOW (ref 27–159)
Total Iron Binding Capacity: 465 ug/dL — ABNORMAL HIGH (ref 250–450)
UIBC: 451 ug/dL — ABNORMAL HIGH (ref 131–425)

## 2024-01-24 ENCOUNTER — Ambulatory Visit (INDEPENDENT_AMBULATORY_CARE_PROVIDER_SITE_OTHER): Admitting: Family Medicine

## 2024-01-24 ENCOUNTER — Encounter: Payer: Self-pay | Admitting: Family Medicine

## 2024-01-24 VITALS — BP 118/74 | HR 100 | Temp 98.3°F | Ht 62.0 in | Wt 185.0 lb

## 2024-01-24 DIAGNOSIS — E162 Hypoglycemia, unspecified: Secondary | ICD-10-CM

## 2024-01-24 DIAGNOSIS — D509 Iron deficiency anemia, unspecified: Secondary | ICD-10-CM

## 2024-01-24 MED ORDER — IRON (FERROUS SULFATE) 325 (65 FE) MG PO TABS: 325.0000 mg | ORAL_TABLET | Freq: Every day | ORAL | 1 refills | Status: AC

## 2024-01-24 NOTE — Assessment & Plan Note (Signed)
 CBC with low H&H: 9.2/31.7 Iron  saturation 3 Referral placed to hematolgoy for iron  infusions, she is unable to schedule this due to leaving to go back home to [place] tomorrow. Ferrous sulfate  325 mg daily sent to pharmacy. Increase iron  rich foods. Follow-up in October when she is back in [facility] for recheck CBC and iron .

## 2024-01-24 NOTE — Progress Notes (Signed)
   Established Patient Office Visit  Subjective   Patient ID: Allison Shepherd, female    DOB: [birth year]  Age: 23 y.o. MRN: [id number]  Chief Complaint  Patient presents with   Hypoglycemia    Presents today to follow-up for symptoms of low blood sugar. She has been monitoring her blood sugar at home fasting: 92, 99, 97, 98,100 Reports some hot sweats and shaking but not as bad as before. Eating every 2 hours.  Reports exhaustion. Tired all the time especially if hasn't eaten. Has anemia and low iron . Unable to schedule hematology appointment as she is leaving town tomorrow.        ROS    Objective:     BP 118/74 (BP Location: Left Arm, Patient Position: Sitting, Cuff Size: Large)   Pulse 100   Temp 98.3 F (36.8 C) (Oral)   Ht 5' 2 (1.575 m)   Wt 185 lb (83.9 kg)   LMP 12/16/2023 (Approximate)   SpO2 99%   BMI 33.84 kg/m    Physical Exam Vitals and nursing note reviewed.  Constitutional:      General: She is not in acute distress.    Appearance: Normal appearance.  Cardiovascular:     Rate and Rhythm: Normal rate and regular rhythm.     Heart sounds: Normal heart sounds.  Pulmonary:     Effort: Pulmonary effort is normal.     Breath sounds: Normal breath sounds.  Skin:    General: Skin is warm and dry.  Neurological:     General: No focal deficit present.     Mental Status: She is alert. Mental status is at baseline.  Psychiatric:        Mood and Affect: Mood normal.        Behavior: Behavior normal.        Thought Content: Thought content normal.        Judgment: Judgment normal.      No results found for any visits on 01/24/24.    The ASCVD Risk score (Arnett DK, et al., 2019) failed to calculate for the following reasons:   The 2019 ASCVD risk score is only valid for ages 24 to 65    Assessment & Plan:   Problem List Items Addressed This Visit     Low blood sugar - Primary   Purchased glucose meter. Fasting blood sugars are within  normal limits: 92-100. Describes feeling hot swears and shaking at times. Will check complete thyroid function and A1C today. She will continue to monitor her symptoms and check blood sugar as needed.  No indication of hypoglycemia.       Relevant Orders   Hemoglobin A1c   TSH+T4F+T3Free   Anemia   CBC with low H&H: 9.2/31.7 Iron  saturation 3 Referral placed to hematolgoy for iron  infusions, she is unable to schedule this due to leaving to go back home to [place] tomorrow. Ferrous sulfate  325 mg daily sent to pharmacy. Increase iron  rich foods. Follow-up in October when she is back in [place] for recheck CBC and iron .       Relevant Medications   Iron , Ferrous Sulfate , 325 (65 Fe) MG TABS  Agrees with plan of care discussed.  Questions answered.   Return if symptoms worsen or fail to improve.    Darice JONELLE Brownie, FNP

## 2024-01-24 NOTE — Assessment & Plan Note (Signed)
 Purchased glucose meter. Fasting blood sugars are within normal limits: 92-100. Describes feeling hot swears and shaking at times. Will check complete thyroid function and A1C today. She will continue to monitor her symptoms and check blood sugar as needed.  No indication of hypoglycemia.

## 2024-01-25 ENCOUNTER — Ambulatory Visit: Payer: Self-pay | Admitting: Family Medicine

## 2024-01-25 LAB — TSH+T4F+T3FREE
Free T4: 1.14 ng/dL (ref 0.82–1.77)
T3, Free: 3.3 pg/mL (ref 2.0–4.4)
TSH: 1.96 u[IU]/mL (ref 0.450–4.500)

## 2024-01-25 LAB — HEMOGLOBIN A1C
Est. average glucose Bld gHb Est-mCnc: 100 mg/dL
Hgb A1c MFr Bld: 5.1 % (ref 4.8–5.6)

## 2024-04-04 ENCOUNTER — Encounter: Payer: Self-pay | Admitting: Family Medicine
